# Patient Record
Sex: Male | Born: 2000 | Race: Black or African American | Hispanic: No | Marital: Single | Smoking: Never smoker
Health system: Southern US, Community
[De-identification: ages and names within clinical notes are randomized; demographics above are authoritative.]

## PROBLEM LIST (undated history)

## (undated) DIAGNOSIS — T7840XA Allergy, unspecified, initial encounter: Secondary | ICD-10-CM

## (undated) DIAGNOSIS — G4730 Sleep apnea, unspecified: Secondary | ICD-10-CM

## (undated) HISTORY — DX: Sleep apnea, unspecified: G47.30

## (undated) HISTORY — DX: Allergy, unspecified, initial encounter: T78.40XA

---
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
## 2023-08-26 ENCOUNTER — Ambulatory Visit: Payer: Self-pay | Admitting: Family Medicine

## 2023-09-05 DIAGNOSIS — E663 Overweight: Secondary | ICD-10-CM | POA: Insufficient documentation

## 2023-09-05 DIAGNOSIS — R0683 Snoring: Secondary | ICD-10-CM | POA: Insufficient documentation

## 2023-11-28 ENCOUNTER — Ambulatory Visit: Payer: Self-pay | Admitting: Family Medicine

## 2023-11-28 ENCOUNTER — Encounter: Payer: Self-pay | Admitting: Family Medicine

## 2023-11-28 VITALS — BP 129/79 | HR 65 | Resp 16 | Ht 71.0 in | Wt 192.1 lb

## 2023-11-28 DIAGNOSIS — R0683 Snoring: Secondary | ICD-10-CM | POA: Diagnosis not present

## 2023-11-28 DIAGNOSIS — G4739 Other sleep apnea: Secondary | ICD-10-CM

## 2023-11-28 DIAGNOSIS — E663 Overweight: Secondary | ICD-10-CM | POA: Diagnosis not present

## 2023-11-28 DIAGNOSIS — Z8709 Personal history of other diseases of the respiratory system: Secondary | ICD-10-CM

## 2023-11-28 NOTE — Progress Notes (Signed)
 New patient visit   Patient: Hector Leonard   DOB: 2000-11-04   23 y.o. Male  MRN: [id number] Visit Date: 11/28/2023  Today's healthcare provider: Rockie Agent, MD   Chief Complaint  Patient presents with   New Patient (Initial Visit)   Subjective    Hector Leonard is a 23 y.o. male who presents today as a new patient to establish care.      Discussed the use of AI scribe software for clinical note transcription with the patient, who gave verbal consent to proceed.  History of Present Illness Hector Leonard is a 23 year old male who presents for establishing care as a new patient.  He has a history of sleep apnea since middle school or early high school, characterized by significant snoring. Family members noted his snoring during a recent cruise, and his father, who also has sleep apnea, suggested he get evaluated. He attempted to set up a home sleep study through [facility] but encountered difficulties with the process. He has not been prescribed a CPAP machine but uses a fan at night to help with his sleep. He experienced restless nights from March to May last year, but this has improved since then. He does not currently experience restlessness or sleeplessness, except during the cruise due to the boat's movement.  His family history includes his father having sleep apnea, which was diagnosed after he began having seizures. His mother has a cousin who had adenoids removed, and his father had his tonsils removed, though it did not alleviate his symptoms.  He does not take any regular medications and has no other significant health issues. He recalls having high cholesterol as a child and an episode of fainting in fifth grade, which was attributed to orthostatic changes due to his height. No shortness of breath or exercise intolerance currently.   Past Medical History:  Diagnosis Date   Allergy    Sleep apnea     Outpatient Medications Prior to Visit   Medication Sig   [DISCONTINUED] cyclobenzaprine  (FLEXERIL ) 5 MG tablet Take 2 tablets (10 mg total) by mouth 2 (two) times daily as needed for muscle spasms.   No facility-administered medications prior to visit.    History reviewed. No pertinent surgical history. Family Status  Relation Name Status   Mother  (Not Specified)   Father  (Not Specified)   MGM  (Not Specified)   PGM  (Not Specified)  No partnership data on file   Family History  Problem Relation Age of Onset   Hypertension Mother    Hypertension Father    Sleep apnea Father    Seizures Father    Stroke Maternal Grandmother    Diabetes Paternal Grandmother    Social History   Socioeconomic History   Marital status: Single    Spouse name: Not on file   Number of children: Not on file   Years of education: Not on file   Highest education level: Not on file  Occupational History   Not on file  Tobacco Use   Smoking status: Never   Smokeless tobacco: Never  Vaping Use   Vaping status: Never Used  Substance and Sexual Activity   Alcohol use: No   Drug use: No   Sexual activity: Not on file  Other Topics Concern   Not on file  Social History Narrative   Not on file   Social Drivers of Health   Financial Resource Strain: Low Risk  (09/05/2023)  [facility]   Overall Financial Resource Strain (CARDIA)    Difficulty of Paying Living Expenses: Not very hard  Food Insecurity: Food Insecurity Present (09/05/2023)   Received from [facility]   Hunger Vital Sign    Within the past 12 months, you worried that your food would run out before you got the money to buy more.: Never true    Within the past 12 months, the food you bought just didn't last and you didn't have money to get more.: Sometimes true  Transportation Needs: No Transportation Needs (09/05/2023)   Received from [facility] - Transportation    In the past 12 months,  has lack of transportation kept you from medical appointments or from getting medications?: No    Lack of Transportation (Non-Medical): No  Physical Activity: Not on file  Stress: Not on file  Social Connections: Not on file     Allergies  Allergen Reactions   Other Other (See Comments) and Nausea Only    cashews    Immunization History  Administered Date(s) Administered   HPV 9-valent 09/06/2014   Meningococcal B, OMV 07/22/2018   Meningococcal Conjugate 10/27/2012   Meningococcal Mcv4o 07/22/2018   Tdap 10/27/2012    Health Maintenance  Topic Date Due   HPV VACCINES (2 - Male 2-dose series) 03/08/2015   HIV Screening  Never done   Meningococcal B Vaccine (2 of 2 - Bexsero SCDM 2-dose series) 01/21/2019   Hepatitis C Screening  Never done   Hepatitis B Vaccines 19-59 Average Risk (1 of 3 - 19+ 3-dose series) Never done   DTaP/Tdap/Td (2 - Td or Tdap) 10/28/2022   COVID-19 Vaccine (1 - 2024-25 season) Never done   Influenza Vaccine  06/23/2024 (Originally 10/25/2023)   Pneumococcal Vaccine  Aged Out    Patient Care Team: Sharma Coyer, MD as PCP - General (Family Medicine)  Review of Systems      Objective    BP 129/79 (BP Location: Left Arm, Patient Position: Sitting, Cuff Size: Normal)   Pulse 65   Resp 16   Ht 5' 11 (1.803 m)   Wt 192 lb 1.6 oz (87.1 kg)   SpO2 97%   BMI 26.79 kg/m      Depression Screen    11/28/2023    2:41 PM  PHQ 2/9 Scores  PHQ - 2 Score 0   No results found for any visits on 11/28/23.   Physical Exam Physical Exam HEENT: Tonsils 2+ size, not inflamed, not enlarged, no exudate. Moist mucous membranes. NECK: Neck circumference 15.5 inches. CHEST: Breathing comfortably with normal effort. No wheezing, no pulmonary abnormalities.      Assessment & Plan      Problem List Items Addressed This Visit     Other sleep apnea   Relevant Orders   Home sleep test   Overweight   Relevant Orders   Home sleep  test   Snoring - Primary   Relevant Orders   Ambulatory referral to ENT   Home sleep test   Other Visit Diagnoses       History of enlarged adenoids       Relevant Orders   Ambulatory referral to ENT       Assessment and Plan Assessment & Plan Suspected sleep apnea with snoring Chronic snoring since middle school, recently exacerbated during a cruise. No CPAP use. Restless nights previously, but no recent restlessness since May. Physical exam shows  2+ tonsils, not inflamed or enlarged, and a neck circumference of 15.5 inches, not indicative of obesity. No shortness of breath or exercise intolerance. Differential diagnosis includes enlarged adenoids or tonsils causing airway obstruction. Prefers home sleep study due to cost considerations and comparable results to center-based studies. - Order home sleep study. - Refer to ear, nose, and throat specialist for evaluation of potential airway obstruction due to tonsils or adenoids.  BMI 26.79 -noted on PE  -recommend balanced diet and regular exercise   Hx of enlarged adenoids  Per patient history  Suspect this could contribute to sleep apnea  Will order sleep study to evaluate for OSA for chronic snoring     Return in about 5 months (around 04/29/2024) for CPE.      Rockie Agent, MD  [facility] [phone] (phone) [phone] (fax)  [facility]

## 2024-04-29 ENCOUNTER — Encounter: Admitting: Family Medicine
